# Patient Record
Sex: Male | Born: 1985 | Hispanic: Yes | Marital: Single | Smoking: Never smoker
Health system: Southern US, Community
[De-identification: ages and names within clinical notes are randomized; demographics above are authoritative.]

---

## 2015-01-13 ENCOUNTER — Emergency Department: Payer: Self-pay

## 2015-01-13 ENCOUNTER — Encounter: Payer: Self-pay | Admitting: *Deleted

## 2015-01-13 ENCOUNTER — Emergency Department
Admission: EM | Admit: 2015-01-13 | Discharge: 2015-01-13 | Disposition: A | Discharge status: Home / Self Care | Payer: Self-pay | Attending: Emergency Medicine | Admitting: Emergency Medicine

## 2015-01-13 ENCOUNTER — Other Ambulatory Visit: Payer: Self-pay

## 2015-01-13 DIAGNOSIS — R1013 Epigastric pain: Secondary | ICD-10-CM

## 2015-01-13 DIAGNOSIS — M549 Dorsalgia, unspecified: Secondary | ICD-10-CM | POA: Insufficient documentation

## 2015-01-13 DIAGNOSIS — K2970 Gastritis, unspecified, without bleeding: Secondary | ICD-10-CM | POA: Insufficient documentation

## 2015-01-13 LAB — CBC WITH DIFFERENTIAL/PLATELET
Basophils Absolute: 0.1 10*3/uL (ref 0–0.1)
Basophils Relative: 1 %
EOS ABS: 0.3 10*3/uL (ref 0–0.7)
Eosinophils Relative: 4 %
HEMATOCRIT: 41.3 % (ref 40.0–52.0)
HEMOGLOBIN: 14.4 g/dL (ref 13.0–18.0)
LYMPHS ABS: 3 10*3/uL (ref 1.0–3.6)
Lymphocytes Relative: 41 %
MCH: 31.1 pg (ref 26.0–34.0)
MCHC: 34.9 g/dL (ref 32.0–36.0)
MCV: 89.2 fL (ref 80.0–100.0)
MONO ABS: 0.6 10*3/uL (ref 0.2–1.0)
MONOS PCT: 8 %
NEUTROS PCT: 46 %
Neutro Abs: 3.4 10*3/uL (ref 1.4–6.5)
Platelets: 341 10*3/uL (ref 150–440)
RBC: 4.63 MIL/uL (ref 4.40–5.90)
RDW: 13.1 % (ref 11.5–14.5)
WBC: 7.3 10*3/uL (ref 3.8–10.6)

## 2015-01-13 LAB — COMPREHENSIVE METABOLIC PANEL
ALBUMIN: 4.3 g/dL (ref 3.5–5.0)
ALK PHOS: 64 U/L (ref 38–126)
ALT: 53 U/L (ref 17–63)
AST: 36 U/L (ref 15–41)
Anion gap: 6 (ref 5–15)
BILIRUBIN TOTAL: 0.2 mg/dL — AB (ref 0.3–1.2)
BUN: 12 mg/dL (ref 6–20)
CO2: 28 mmol/L (ref 22–32)
CREATININE: 0.98 mg/dL (ref 0.61–1.24)
Calcium: 9.3 mg/dL (ref 8.9–10.3)
Chloride: 106 mmol/L (ref 101–111)
GFR calc Af Amer: >60 mL/min (ref >60)
GFR calc non Af Amer: >60 mL/min (ref >60)
GLUCOSE: 101 mg/dL — AB (ref 65–99)
POTASSIUM: 4 mmol/L (ref 3.5–5.1)
Sodium: 140 mmol/L (ref 135–145)
TOTAL PROTEIN: 7.7 g/dL (ref 6.5–8.1)

## 2015-01-13 LAB — URINALYSIS COMPLETE WITH MICROSCOPIC (ARMC ONLY)
BACTERIA UA: NONE SEEN
Bilirubin Urine: NEGATIVE
Glucose, UA: NEGATIVE mg/dL
Hgb urine dipstick: NEGATIVE
Ketones, ur: NEGATIVE mg/dL
Leukocytes, UA: NEGATIVE
Nitrite: NEGATIVE
PROTEIN: NEGATIVE mg/dL
RBC / HPF: NONE SEEN RBC/hpf (ref 0–5)
Specific Gravity, Urine: 1.013 (ref 1.005–1.030)
pH: 5 (ref 5.0–8.0)

## 2015-01-13 LAB — LIPASE, BLOOD: Lipase: 30 U/L (ref 22–51)

## 2015-01-13 LAB — TROPONIN I: Troponin I: <0.03 ng/mL (ref <0.031)

## 2015-01-13 MED ORDER — GI COCKTAIL ~~LOC~~
30.0000 mL | Freq: Once | ORAL | Status: AC
  Administered 2015-01-13: 30 mL via ORAL
  Filled 2015-01-13: qty 30

## 2015-01-13 MED ORDER — HYDROCODONE-ACETAMINOPHEN 5-325 MG PO TABS: 1.0000 | ORAL_TABLET | ORAL | Status: AC | PRN

## 2015-01-13 MED ORDER — IOHEXOL 240 MG/ML SOLN
25.0000 mL | Freq: Once | INTRAMUSCULAR | Status: DC | PRN
  Administered 2015-01-13: 25 mL via ORAL
  Filled 2015-01-13: qty 50

## 2015-01-13 MED ORDER — IOHEXOL 300 MG/ML  SOLN
100.0000 mL | Freq: Once | INTRAMUSCULAR | Status: AC | PRN
  Administered 2015-01-13: 100 mL via INTRAVENOUS

## 2015-01-13 MED ORDER — SODIUM CHLORIDE 0.9 % IV BOLUS (SEPSIS)
1000.0000 mL | Freq: Once | INTRAVENOUS | Status: AC
  Administered 2015-01-13: 1000 mL via INTRAVENOUS

## 2015-01-13 MED ORDER — MORPHINE SULFATE (PF) 4 MG/ML IV SOLN
4.0000 mg | Freq: Once | INTRAVENOUS | Status: AC
  Administered 2015-01-13: 4 mg via INTRAVENOUS
  Filled 2015-01-13: qty 1

## 2015-01-13 MED ORDER — ONDANSETRON HCL 4 MG/2ML IJ SOLN
4.0000 mg | Freq: Once | INTRAMUSCULAR | Status: AC
  Administered 2015-01-13: 4 mg via INTRAVENOUS
  Filled 2015-01-13: qty 2

## 2015-01-13 MED ORDER — PANTOPRAZOLE SODIUM 40 MG PO TBEC: 40.0000 mg | DELAYED_RELEASE_TABLET | Freq: Every day | ORAL | Status: AC

## 2015-01-13 NOTE — ED Notes (Addendum)
Per EMS:  Pt from home. Reports that he started having epigastric pain 12 hours PTA after eating spicy foods.  Denies N/V.  Reports diarrhea.  Reports heavy ETOH intake on Sunday.  Pt tender on palpation of his epigastric region.  LBM: this morning.  Denies urinary symptoms.  States that he has had this pain 2-3 times in the past.

## 2015-01-13 NOTE — Discharge Instructions (Signed)
Please take your medications as prescribed. Please follow-up with GI medicine by calling the number provided to arrange a follow-up appointment for further evaluation. Return to the emergency department for any worsening abdominal pain. Please avoid alcohol intake.    Dolor abdominal (Abdominal Pain) El dolor puede tener muchas causas. Normalmente la causa del dolor abdominal no es una enfermedad y Scientist, clinical (histocompatibility and immunogenetics) sin TEFL teacher. Frecuentemente puede controlarse y tratarse en casa. Su mdico le Medical sales representative examen fsico y posiblemente solicite anlisis de sangre y radiografas para ayudar a Chief Strategy Officer la gravedad de su dolor. Sin embargo, en [employer], debe transcurrir ms tiempo antes de que se pueda Clinical research associate una causa evidente del dolor. Antes de llegar a ese punto, es posible que su mdico no sepa si necesita ms pruebas o un tratamiento ms profundo. INSTRUCCIONES PARA EL CUIDADO EN EL HOGAR  Est atento al dolor para ver si hay cambios. Las siguientes indicaciones ayudarn a Architectural technologist que pueda sentir:  Valle solo medicamentos de venta libre o recetados, segn las indicaciones del mdico.  No tome laxantes a menos que se lo haya indicado su mdico.  Pruebe con Neomia Dear dieta lquida absoluta (caldo, t o agua) segn se lo indique su mdico. Introduzca gradualmente una dieta normal, segn su tolerancia. SOLICITE ATENCIN MDICA SI:  Tiene dolor abdominal sin explicacin.  Tiene dolor abdominal relacionado con nuseas o diarrea.  Tiene dolor cuando orina o defeca.  Experimenta dolor abdominal que lo despierta de noche.  Tiene dolor abdominal que empeora o mejora cuando come alimentos.  Tiene dolor abdominal que empeora cuando come alimentos grasosos.  Tiene fiebre. SOLICITE ATENCIN MDICA DE INMEDIATO SI:   El dolor no desaparece en un plazo mximo de 2horas.  No deja de (vomitar).  El Engineer, mining se siente solo en partes del abdomen, como el lado derecho o la parte  inferior izquierda del abdomen.  Evaca materia fecal sanguinolenta o negra, de aspecto alquitranado. ASEGRESE DE QUE:  Comprende estas instrucciones.  Controlar su afeccin.  Recibir ayuda de inmediato si no mejora o si empeora. Document Released: 04/03/2005 Document Revised: 04/08/2013 [facility] Patient Information 2015 [place], [place]. This information is not intended to replace advice given to you by your health care provider. Make sure you discuss any questions you have with your health care provider.

## 2015-01-13 NOTE — ED Provider Notes (Signed)
[facility] Emergency Department Provider Note  Time seen: 8:18 AM  I have reviewed the triage vital signs and the nursing notes.  Interpreter used for this visit.   HISTORY  Chief Complaint Abdominal Pain    HPI Luis Malone is a 29 y.o. male with no past medical history who presents the emergency department up for abdominal pain. According to the patient overnight he developed severe upper abdominal pain. He cannot give a specific time of onset. States he awoke with severe upper abdominal pain, feeling nauseated. Denies any vomiting, constipation, but does state one loose stool this morning. Patient does admit daily alcohol use (3-4 beers daily), and heavy alcohol use on Sunday. Denies any pain with eating. States this has happened 3-4 times in the past last of which was about 3 months ago but it resolved on its own. Currently describes the pain as 10/10, burning/sharp pain in the epigastric region. No modifying factors identified.    History reviewed. No pertinent past medical history.  There are no active problems to display for this patient.   History reviewed. No pertinent past surgical history.  No current outpatient prescriptions on file.  Allergies Review of patient's allergies indicates no known allergies.  History reviewed. No pertinent family history.  Social History Social History  Substance Use Topics  . Smoking status: Never Smoker   . Smokeless tobacco: None  . Alcohol Use: 2.4 oz/week    4 Cans of beer per week    Review of Systems Constitutional: Negative for fever Cardiovascular: Negative for chest pain. Respiratory: Negative for shortness of breath. Gastrointestinal: Positive for upper abdominal pain and nausea. Musculoskeletal: States the pain radiates to his back. Neurological: Negative for headache 10-point ROS otherwise negative.  ____________________________________________   PHYSICAL EXAM:  VITAL  SIGNS: ED Triage Vitals  Enc Vitals Group     BP 01/13/15 0738 128/102 mmHg     Pulse Rate 01/13/15 0738 65     Resp 01/13/15 0738 18     Temp 01/13/15 0738 98.3 F (36.8 C)     Temp Source 01/13/15 0738 Oral     SpO2 01/13/15 0738 100 %     Weight 01/13/15 0738 180 lb (81.647 kg)     Height 01/13/15 0738  (1.753 m)     Head Cir --      Peak Flow --      Pain Score 01/13/15 0736 5     Pain Loc --      Pain Edu? --      Excl. in GC? --     Constitutional: Alert and oriented. Mild distress due to pain. Eyes: Normal exam ENT   Mouth/Throat: Mucous membranes are moist. Cardiovascular: Normal rate, regular rhythm. No murmur Respiratory: Normal respiratory effort without tachypnea nor retractions. Breath sounds are clear and equal bilaterally. No wheezes/rales/rhonchi. Gastrointestinal: Soft, mild epigastric tenderness palpation. No rebound or guarding. No distention. Bowel sounds present. Musculoskeletal: Nontender with normal range of motion in all extremities.  Neurologic:  Normal speech and language. No gross focal neurologic deficits Skin:  Skin is warm, dry and intact.  Psychiatric: Mood and affect are normal. Speech and behavior are normal  ____________________________________________    EKG  EKG reviewed and interpreted by myself shows sinus bradycardia at 59 bpm, narrow QRS, normal axis, normal intervals, no ST changes noted. Overall normal EKG.  ____________________________________________    RADIOLOGY  CT abdomen shows no acute abnormality.  ____________________________________________    INITIAL IMPRESSION /  ASSESSMENT AND PLAN / ED COURSE  Pertinent labs & imaging results that were available during my care of the patient were reviewed by me and considered in my medical decision making (see chart for details).  Patient presents with upper abdominal pain which began overnight. History of heavy alcohol use. We will treat pain, check labs including  lipase, troponin, and obtain an EKG. Differential this time would include pancreatitis, gastritis, and biliary disease, however the patient does not have any right upper quadrant tenderness.  CT negative. Patient's pain is gone currently. Most consistent with gastritis will discharge on Norco and Protonix with GI follow-up. I discussed this with the patient as well as return precautions, the patient is agreeable.  ____________________________________________   FINAL CLINICAL IMPRESSION(S) / ED DIAGNOSES  Epigastric pain Gastritis  Minna Antis, MD 01/13/15 1030

## 2016-12-28 IMAGING — CT CT ABD-PELV W/ CM
1 of 2 series · 15 of 32 positions shown, 19 images · IV contrast (omnipaque)
Comparison: None.

CLINICAL DATA: Epigastric pain for 12 hours. No nausea or vomiting.

EXAM:
CT ABDOMEN AND PELVIS WITH CONTRAST
TECHNIQUE: Multidetector CT imaging of the abdomen and pelvis was performed
using the standard protocol following bolus administration of
intravenous contrast.
CONTRAST:  100mL OMNIPAQUE IOHEXOL 300 MG/ML  SOLN

[Series 2: routine abd pel with · axial · 0.78mm/px · z∈[-967,-457]mm · 15 of 112 slices shown, 19 images]
[im 5/112  soft-tissue]
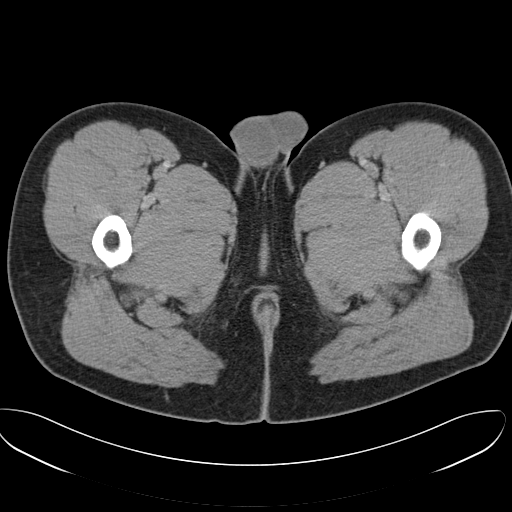
[im 5/112  bone]
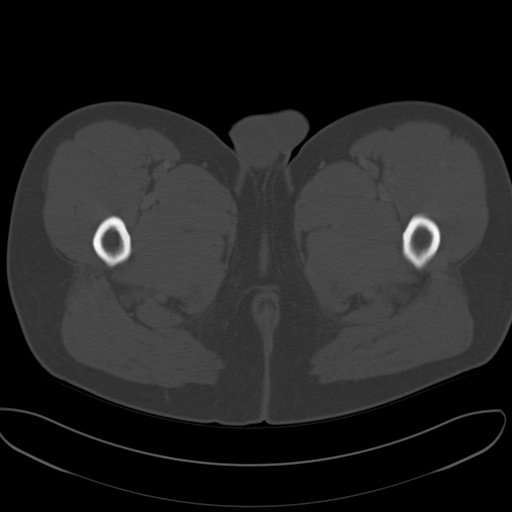
[im 15/112  soft-tissue]
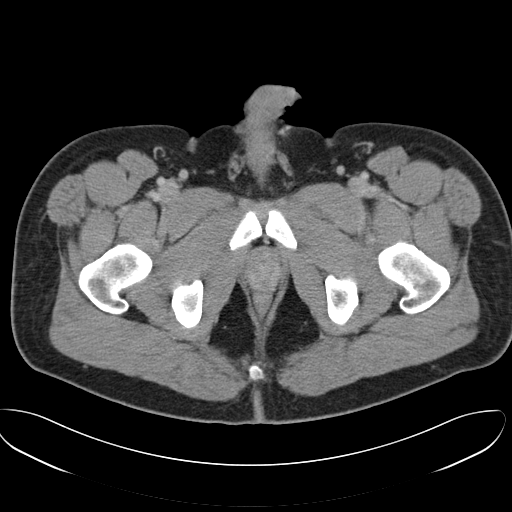
[im 25/112  soft-tissue]
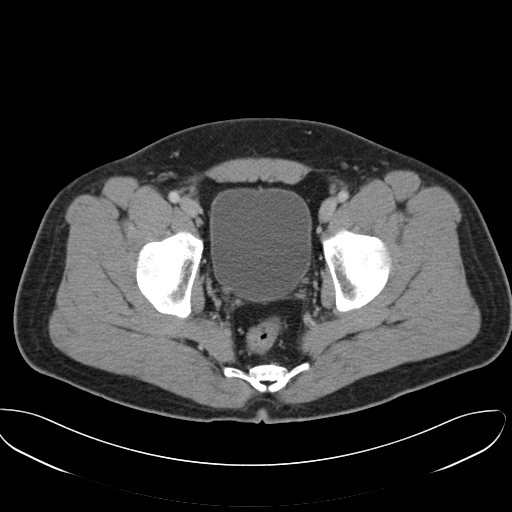
[im 29/112  soft-tissue]
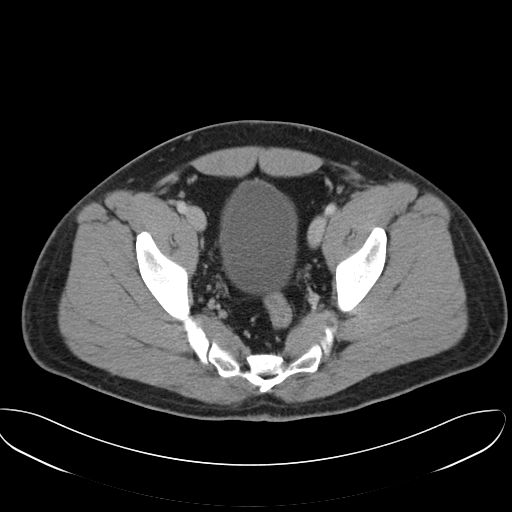
[im 39/112  soft-tissue]
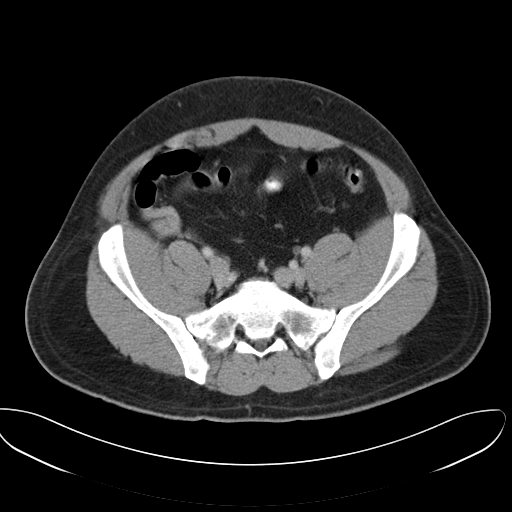
[im 49/112  soft-tissue]
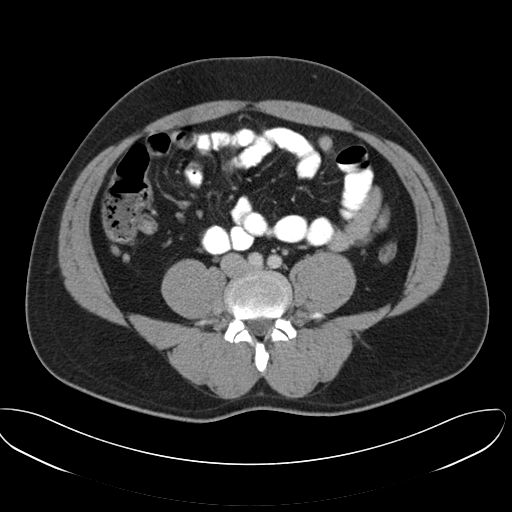
[im 58/112  soft-tissue]
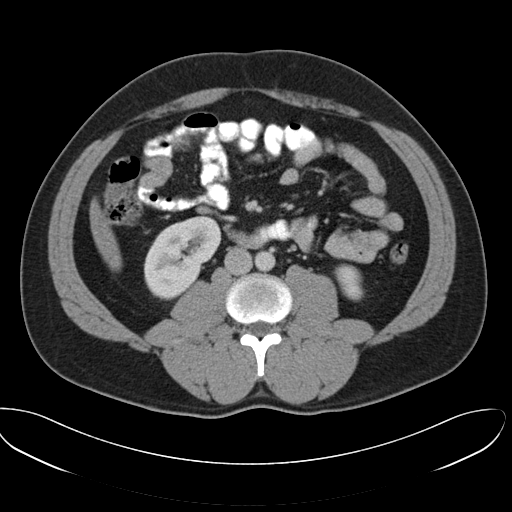
[im 63/112  soft-tissue]
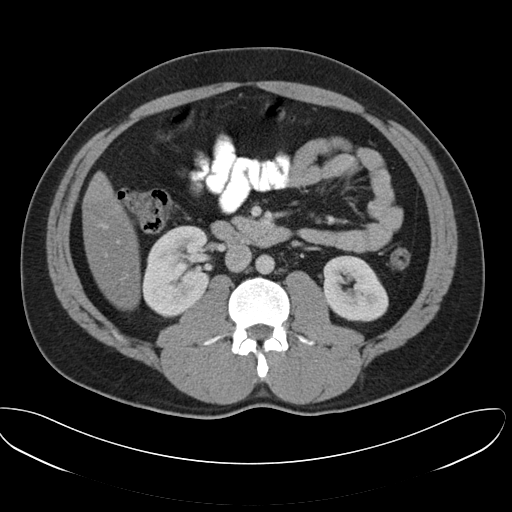
[im 73/112  soft-tissue]
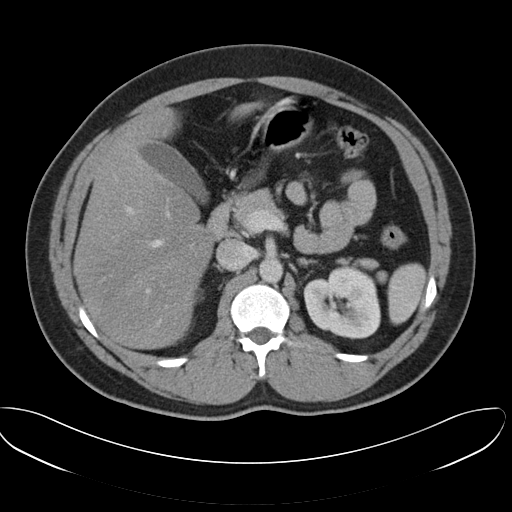
[im 73/112  bone]
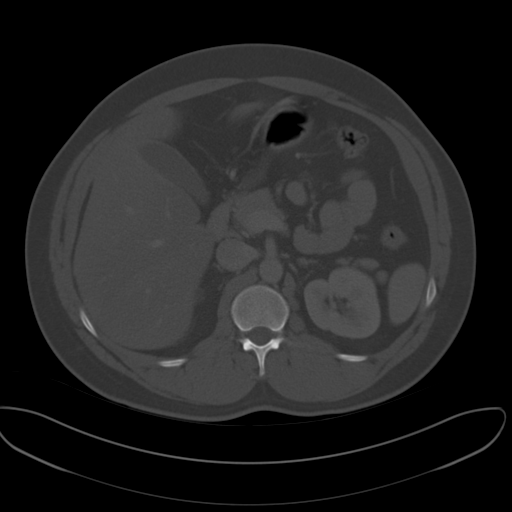
[im 83/112  soft-tissue]
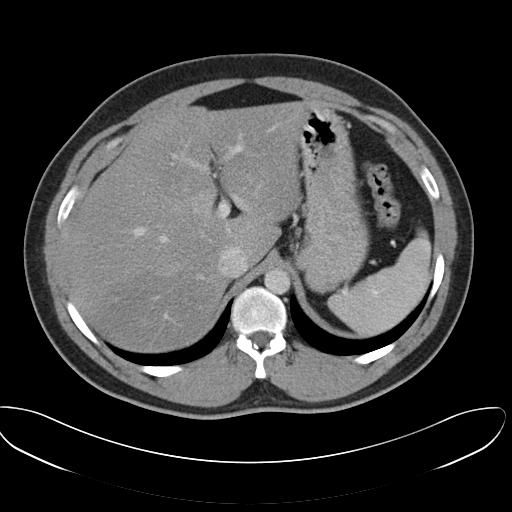
[im 87/112  soft-tissue]
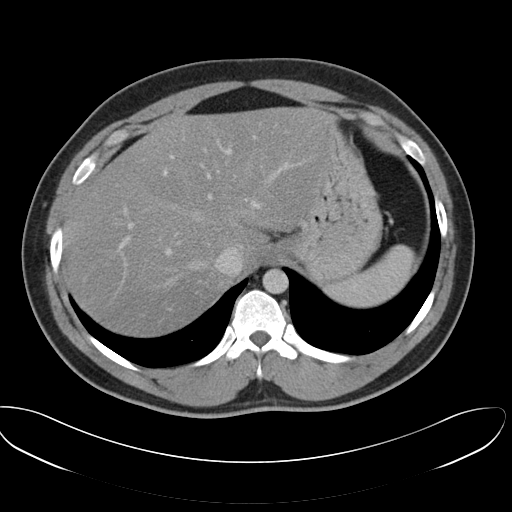
[im 92/112  lung]
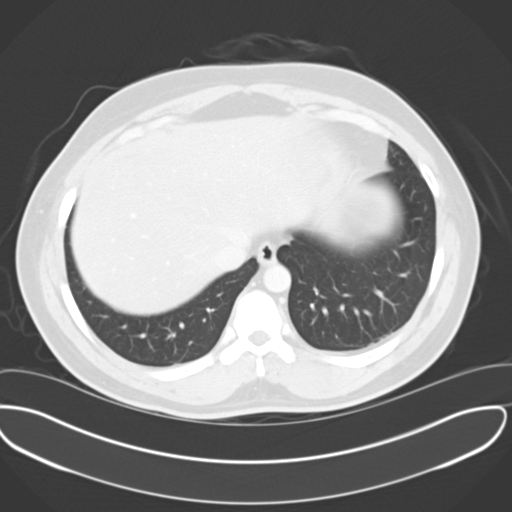
[im 97/112  soft-tissue]
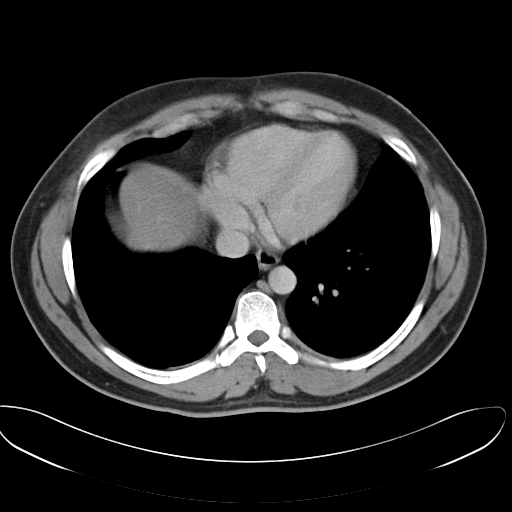
[im 97/112  lung]
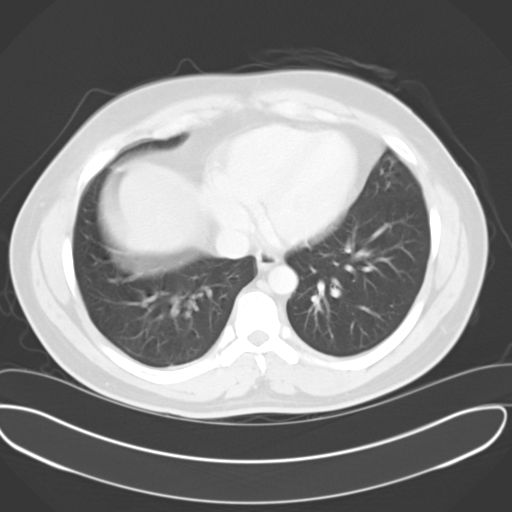
[im 102/112  lung]
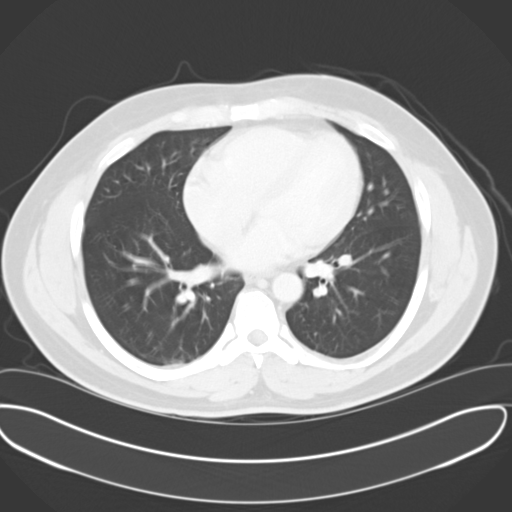
[im 107/112  soft-tissue]
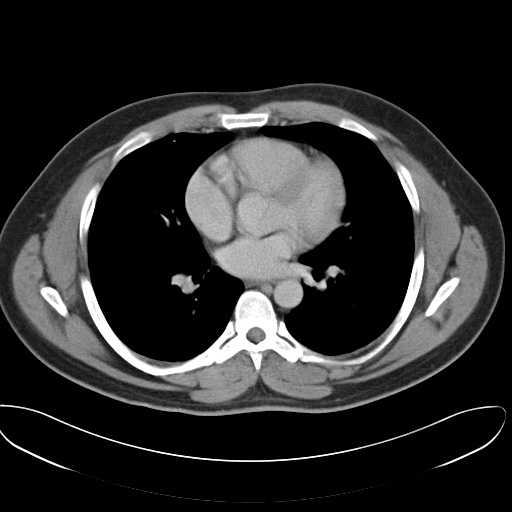
[im 107/112  lung]
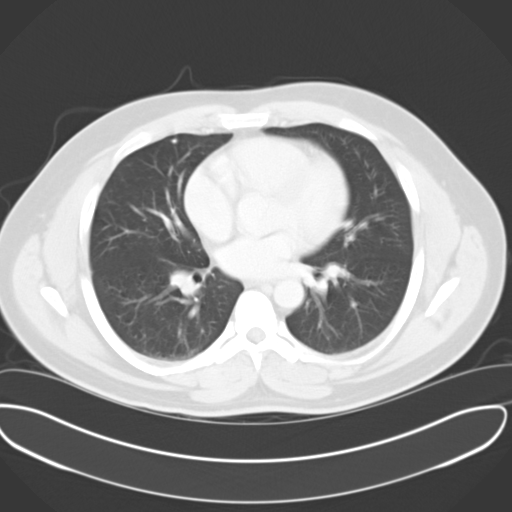

[15 of 32 positions shown; findings below may reference images not displayed]

FINDINGS: Lower chest:  Clear lung bases.  Normal heart size.

Hepatobiliary: Diffuse low attenuation of the liver as can be seen
with hepatic steatosis. No focal hepatic mass. No intrahepatic or
extrahepatic biliary ductal dilatation. Normal gallbladder.

Pancreas: Normal.

Spleen: Normal.

Adrenals/Urinary Tract: Normal adrenal glands. Normal kidneys.
Normal bladder.

Stomach/Bowel: No bowel wall thickening or bowel dilatation. Normal
appendix. No pneumatosis, pneumoperitoneum or portal venous gas. No
abdominal or pelvic free fluid.

Vascular/Lymphatic: Normal caliber abdominal aorta. No abdominal or
pelvic lymphadenopathy.

Other: No fluid collection or hematoma.  Normal prostate.

Musculoskeletal: No lytic or sclerotic osseous lesion. No acute
osseous abnormality. Mild broad-based disc bulge at L5-S1.
IMPRESSION: 1. No acute abdominal or pelvic pathology.
2. Hepatic steatosis.
